# Patient Record
Sex: Female | Born: 1995 | Race: Black or African American | Hispanic: No | Marital: Single | Smoking: Never smoker
Health system: Southern US, Community
[De-identification: ages and names within clinical notes are randomized; demographics above are authoritative.]

## PROBLEM LIST (undated history)

## (undated) ENCOUNTER — Ambulatory Visit (HOSPITAL_COMMUNITY): Discharge status: Absent without leave | Payer: Self-pay

## (undated) DIAGNOSIS — D649 Anemia, unspecified: Secondary | ICD-10-CM

## (undated) DIAGNOSIS — J45909 Unspecified asthma, uncomplicated: Secondary | ICD-10-CM

## (undated) DIAGNOSIS — E119 Type 2 diabetes mellitus without complications: Secondary | ICD-10-CM

---

## 2017-08-11 ENCOUNTER — Emergency Department (HOSPITAL_COMMUNITY): Payer: Self-pay

## 2017-08-11 ENCOUNTER — Encounter (HOSPITAL_COMMUNITY): Payer: Self-pay

## 2017-08-11 ENCOUNTER — Emergency Department (HOSPITAL_COMMUNITY)
Admission: EM | Admit: 2017-08-11 | Discharge: 2017-08-11 | Disposition: A | Discharge status: Home / Self Care | Payer: Self-pay | Attending: Emergency Medicine | Admitting: Emergency Medicine

## 2017-08-11 DIAGNOSIS — J4521 Mild intermittent asthma with (acute) exacerbation: Secondary | ICD-10-CM | POA: Insufficient documentation

## 2017-08-11 DIAGNOSIS — E119 Type 2 diabetes mellitus without complications: Secondary | ICD-10-CM | POA: Insufficient documentation

## 2017-08-11 HISTORY — DX: Type 2 diabetes mellitus without complications: E11.9

## 2017-08-11 HISTORY — DX: Anemia, unspecified: D64.9

## 2017-08-11 LAB — CBG MONITORING, ED: GLUCOSE-CAPILLARY: 81 mg/dL (ref 65–99)

## 2017-08-11 MED ORDER — ALBUTEROL SULFATE HFA 108 (90 BASE) MCG/ACT IN AERS
1.0000 | INHALATION_SPRAY | RESPIRATORY_TRACT | Status: DC | PRN
  Administered 2017-08-11: 1 via RESPIRATORY_TRACT
  Filled 2017-08-11: qty 6.7

## 2017-08-11 MED ORDER — ALBUTEROL SULFATE (2.5 MG/3ML) 0.083% IN NEBU
5.0000 mg | INHALATION_SOLUTION | Freq: Once | RESPIRATORY_TRACT | Status: AC
  Administered 2017-08-11: 5 mg via RESPIRATORY_TRACT
  Filled 2017-08-11: qty 6

## 2017-08-11 MED ORDER — AEROCHAMBER PLUS FLO-VU LARGE MISC
1.0000 | Freq: Once | Status: AC
  Administered 2017-08-11: 1
  Filled 2017-08-11 (×3): qty 1

## 2017-08-11 MED ORDER — ALBUTEROL SULFATE (2.5 MG/3ML) 0.083% IN NEBU
2.5000 mg | INHALATION_SOLUTION | Freq: Once | RESPIRATORY_TRACT | Status: AC
  Administered 2017-08-11: 2.5 mg via RESPIRATORY_TRACT
  Filled 2017-08-11: qty 3

## 2017-08-11 MED ORDER — AEROCHAMBER PLUS FLO-VU MEDIUM MISC
1.0000 | Freq: Once | Status: DC
  Filled 2017-08-11: qty 1

## 2017-08-11 MED ORDER — DEXAMETHASONE SODIUM PHOSPHATE 10 MG/ML IJ SOLN
10.0000 mg | Freq: Once | INTRAMUSCULAR | Status: AC
  Administered 2017-08-11: 10 mg via INTRAMUSCULAR
  Filled 2017-08-11: qty 1

## 2017-08-11 NOTE — Discharge Instructions (Signed)
Avoid second hand smoke.  °

## 2017-08-11 NOTE — ED Provider Notes (Signed)
[facility] EMERGENCY DEPARTMENT Provider Note   CSN: [id number] Arrival date & time: 08/11/17  1314     History   Chief Complaint Chief Complaint  Patient presents with  . Asthma    HPI Tina Carlson is a 22 y.o. female.  Pt presents to the ED today with sob.  Pt is here visiting from [place] and is out of her inhaler.  Pt said there is 2nd hand smoke where she is staying.  Pt denies f/c.  Pt did get 1 neb in triage and has improved, but is still having some wheezing.      Past Medical History:  Diagnosis Date  . Anemia   . Diabetes mellitus without complication (HCC)     There are no active problems to display for this patient.   History reviewed. No pertinent surgical history.  OB History    No data available       Home Medications    Prior to Admission medications   Not on File    Family History No family history on file.  Social History Social History   Tobacco Use  . Smoking status: Never Smoker  . Smokeless tobacco: Never Used  Substance Use Topics  . Alcohol use: No    Frequency: Never  . Drug use: No     Allergies   Patient has no known allergies.   Review of Systems Review of Systems  Respiratory: Positive for cough, shortness of breath and wheezing.   All other systems reviewed and are negative.    Physical Exam Updated Vital Signs BP (!) 129/38 (BP Location: Right Arm)   Pulse 76   Temp 97.9 F (36.6 C) (Oral)   Resp (!) 22   Ht 5\' 6"  (1.676 m)   Wt 90.7 kg (200 lb)   LMP 07/11/2017 (Approximate)   SpO2 100%   BMI 32.28 kg/m   Physical Exam  Constitutional: She is oriented to person, place, and time. She appears well-developed and well-nourished.  HENT:  Head: Normocephalic and atraumatic.  Right Ear: External ear normal.  Left Ear: External ear normal.  Nose: Nose normal.  Mouth/Throat: Oropharynx is clear and moist.  Eyes: Conjunctivae and EOM are normal. Pupils are equal, round, and  reactive to light.  Neck: Normal range of motion. Neck supple.  Cardiovascular: Normal rate, regular rhythm, normal heart sounds and intact distal pulses.  Pulmonary/Chest: She has wheezes.  Abdominal: Soft. Bowel sounds are normal.  Musculoskeletal: Normal range of motion.  Neurological: She is alert and oriented to person, place, and time.  Skin: Skin is warm. Capillary refill takes less than 2 seconds.  Psychiatric: She has a normal mood and affect. Her behavior is normal. Judgment and thought content normal.  Nursing note and vitals reviewed.    ED Treatments / Results  Labs (all labs ordered are listed, but only abnormal results are displayed) Labs Reviewed  CBG MONITORING, ED    EKG  EKG Interpretation None       Radiology Dg Chest 2 View  Result Date: 08/11/2017 CLINICAL DATA:  Acute onset of cough and shortness of breath. Current history of asthma. EXAM: CHEST  2 VIEW COMPARISON:  None. FINDINGS: Cardiomediastinal silhouette unremarkable. Mildly prominent bronchovascular markings diffusely and mild to moderate central peribronchial thickening. Lungs otherwise clear. No localized airspace consolidation. No pleural effusions. No pneumothorax. Normal pulmonary vascularity. Visualized bony thorax intact. IMPRESSION: Mild-to-moderate changes of bronchitis and/or asthma without focal airspace pneumonia. Electronically Signed  By: Hulan Saashomas  Lawrence M.D.   On: 08/11/2017 15:04    Procedures Procedures (including critical care time)  Medications Ordered in ED Medications  albuterol (PROVENTIL HFA;VENTOLIN HFA) 108 (90 Base) MCG/ACT inhaler 1-2 puff (1 puff Inhalation Given 08/11/17 1624)  AEROCHAMBER PLUS FLO-VU MEDIUM MISC 1 each (not administered)  albuterol (PROVENTIL) (2.5 MG/3ML) 0.083% nebulizer solution 5 mg (5 mg Nebulization Given 08/11/17 1339)  albuterol (PROVENTIL) (2.5 MG/3ML) 0.083% nebulizer solution 2.5 mg (2.5 mg Nebulization Given 08/11/17 1621)  dexamethasone  (DECADRON) injection 10 mg (10 mg Intramuscular Given 08/11/17 1621)     Initial Impression / Assessment and Plan / ED Course  I have reviewed the triage vital signs and the nursing notes.  Pertinent labs & imaging results that were available during my care of the patient were reviewed by me and considered in my medical decision making (see chart for details).     Pt is feeling much better.  She is given an inhaler and spacer prior to d/c.  She knows to return if worse.  Final Clinical Impressions(s) / ED Diagnoses   Final diagnoses:  Mild intermittent asthma with exacerbation    ED Discharge Orders    None       Jacalyn LefevreHaviland, Lindzey Zent, MD 08/11/17 1649

## 2017-08-11 NOTE — ED Triage Notes (Signed)
HX of asthma. Visiting from [place] and ran out of inhaler last night. SPO2 100%. Wheezing audible.

## 2017-08-11 NOTE — ED Notes (Signed)
CBG taken at 16:30 was 81.

## 2020-10-09 ENCOUNTER — Encounter (HOSPITAL_COMMUNITY): Payer: Self-pay | Admitting: Emergency Medicine

## 2020-10-09 ENCOUNTER — Emergency Department (HOSPITAL_COMMUNITY)
Admission: EM | Admit: 2020-10-09 | Discharge: 2020-10-10 | Disposition: A | Discharge status: Home / Self Care | Payer: Self-pay | Attending: Emergency Medicine | Admitting: Emergency Medicine

## 2020-10-09 ENCOUNTER — Other Ambulatory Visit: Payer: Self-pay

## 2020-10-09 DIAGNOSIS — E119 Type 2 diabetes mellitus without complications: Secondary | ICD-10-CM | POA: Insufficient documentation

## 2020-10-09 DIAGNOSIS — K529 Noninfective gastroenteritis and colitis, unspecified: Secondary | ICD-10-CM | POA: Insufficient documentation

## 2020-10-09 LAB — COMPREHENSIVE METABOLIC PANEL
ALT: 15 U/L (ref 0–44)
AST: 20 U/L (ref 15–41)
Albumin: 3.2 g/dL — ABNORMAL LOW (ref 3.5–5.0)
Alkaline Phosphatase: 56 U/L (ref 38–126)
Anion gap: 5 (ref 5–15)
BUN: 13 mg/dL (ref 6–20)
CO2: 22 mmol/L (ref 22–32)
Calcium: 8.6 mg/dL — ABNORMAL LOW (ref 8.9–10.3)
Chloride: 108 mmol/L (ref 98–111)
Creatinine, Ser: 0.95 mg/dL (ref 0.44–1.00)
GFR, Estimated: >60 mL/min (ref >60)
Glucose, Bld: 85 mg/dL (ref 70–99)
Potassium: 3.5 mmol/L (ref 3.5–5.1)
Sodium: 135 mmol/L (ref 135–145)
Total Bilirubin: 0.6 mg/dL (ref 0.3–1.2)
Total Protein: 6.6 g/dL (ref 6.5–8.1)

## 2020-10-09 LAB — CBC
HCT: 39.7 % (ref 36.0–46.0)
Hemoglobin: 13.4 g/dL (ref 12.0–15.0)
MCH: 29.6 pg (ref 26.0–34.0)
MCHC: 33.8 g/dL (ref 30.0–36.0)
MCV: 87.6 fL (ref 80.0–100.0)
Platelets: 237 10*3/uL (ref 150–400)
RBC: 4.53 MIL/uL (ref 3.87–5.11)
RDW: 13 % (ref 11.5–15.5)
WBC: 3.8 10*3/uL — ABNORMAL LOW (ref 4.0–10.5)
nRBC: 0 % (ref 0.0–0.2)

## 2020-10-09 LAB — I-STAT BETA HCG BLOOD, ED (MC, WL, AP ONLY): I-stat hCG, quantitative: <5 m[IU]/mL (ref <5)

## 2020-10-09 LAB — LIPASE, BLOOD: Lipase: 24 U/L (ref 11–51)

## 2020-10-09 MED ORDER — DICYCLOMINE HCL 10 MG PO CAPS
10.0000 mg | ORAL_CAPSULE | Freq: Once | ORAL | Status: AC
  Administered 2020-10-10: 10 mg via ORAL
  Filled 2020-10-09: qty 1

## 2020-10-09 MED ORDER — LOPERAMIDE HCL 2 MG PO CAPS
4.0000 mg | ORAL_CAPSULE | Freq: Once | ORAL | Status: AC
  Administered 2020-10-10: 4 mg via ORAL
  Filled 2020-10-09: qty 2

## 2020-10-09 MED ORDER — ONDANSETRON 4 MG PO TBDP
4.0000 mg | ORAL_TABLET | Freq: Once | ORAL | Status: AC
  Administered 2020-10-10: 4 mg via ORAL
  Filled 2020-10-09: qty 1

## 2020-10-09 NOTE — ED Triage Notes (Signed)
Patient reports generalized abdominal pain for 3 days , no emesis , occasional diarrhea , denies fever or chills .

## 2020-10-10 MED ORDER — ONDANSETRON 4 MG PO TBDP: 4.0000 mg | ORAL_TABLET | Freq: Three times a day (TID) | ORAL | 0 refills | Status: AC | PRN

## 2020-10-10 NOTE — ED Notes (Signed)
After being roomed and provided a gown, patient left room to lobby to pick up her food. Unable to obtain VS as patient was not in room.

## 2020-10-10 NOTE — ED Provider Notes (Signed)
[facility] EMERGENCY DEPARTMENT Provider Note   CSN: [id number] Arrival date & time: 10/09/20  2015     History Chief Complaint  Patient presents with  . Abdominal Pain    Tina Carlson is a 25 y.o. female.  HPI     This is a 25 year old female with a history of diabetes who presents with nausea, anorexia, diarrhea.  Patient reports 3 to 4-day history of the symptoms.  She feels like she may have a stomach virus.  She has had some diffuse abdominal discomfort that she rates a 7 out of 10.  She is not had any vomiting but has had nausea.  No fevers.  No chest pain, shortness of breath, cough.  She reports that she has a nephew with similar symptoms.  She has taken Tylenol at home with minimal relief.  She states that she has been able to keep down some fluids and crackers.  Generally she does not feel like eating.  She does not believe herself to be pregnant.  Past Medical History:  Diagnosis Date  . Anemia   . Diabetes mellitus without complication (HCC)     There are no problems to display for this patient.   History reviewed. No pertinent surgical history.   OB History   No obstetric history on file.     No family history on file.  Social History   Tobacco Use  . Smoking status: Never Smoker  . Smokeless tobacco: Never Used  Substance Use Topics  . Alcohol use: No  . Drug use: No    Home Medications Prior to Admission medications   Medication Sig Start Date End Date Taking? Authorizing Provider  ondansetron (ZOFRAN ODT) 4 MG disintegrating tablet Take 1 tablet (4 mg total) by mouth every 8 (eight) hours as needed for nausea or vomiting. 10/10/20  Yes Markos Theil, Mayer Masker, MD    Allergies    Patient has no known allergies.  Review of Systems   Review of Systems  Constitutional: Negative for fever.  Respiratory: Negative for cough and shortness of breath.   Cardiovascular: Negative for chest pain.  Gastrointestinal: Positive for  abdominal pain, diarrhea and nausea. Negative for vomiting.  Genitourinary: Negative for dysuria.  All other systems reviewed and are negative.   Physical Exam Updated Vital Signs BP (!) 103/57   Pulse 60   Temp 99.7 F (37.6 C) (Oral)   Resp 12   Ht 1.626 m (5\' 4" )   Wt 85 kg   SpO2 100%   BMI 32.17 kg/m   Physical Exam Vitals and nursing note reviewed.  Constitutional:      Appearance: She is well-developed. She is obese. She is not ill-appearing.  HENT:     Head: Normocephalic and atraumatic.     Mouth/Throat:     Mouth: Mucous membranes are moist.  Eyes:     Pupils: Pupils are equal, round, and reactive to light.  Cardiovascular:     Rate and Rhythm: Normal rate and regular rhythm.     Heart sounds: Normal heart sounds.  Pulmonary:     Effort: Pulmonary effort is normal. No respiratory distress.     Breath sounds: No wheezing.  Abdominal:     General: Bowel sounds are normal.     Palpations: Abdomen is soft.     Tenderness: There is no abdominal tenderness.  Musculoskeletal:     Cervical back: Neck supple.  Skin:    General: Skin is warm and dry.  Neurological:     Mental Status: She is alert and oriented to person, place, and time.  Psychiatric:        Mood and Affect: Mood normal.     ED Results / Procedures / Treatments   Labs (all labs ordered are listed, but only abnormal results are displayed) Labs Reviewed  COMPREHENSIVE METABOLIC PANEL - Abnormal; Notable for the following components:      Result Value   Calcium 8.6 (*)    Albumin 3.2 (*)    All other components within normal limits  CBC - Abnormal; Notable for the following components:   WBC 3.8 (*)    All other components within normal limits  LIPASE, BLOOD  URINALYSIS, ROUTINE W REFLEX MICROSCOPIC  I-STAT BETA HCG BLOOD, ED ([facility], [facility], [facility] ONLY)    EKG None  Radiology No results found.  Procedures Procedures   Medications Ordered in ED Medications  ondansetron (ZOFRAN-ODT)  disintegrating tablet 4 mg (4 mg Oral Given 10/10/20 0026)  loperamide (IMODIUM) capsule 4 mg (4 mg Oral Given 10/10/20 0026)  dicyclomine (BENTYL) capsule 10 mg (10 mg Oral Given 10/10/20 0026)    ED Course  I have reviewed the triage vital signs and the nursing notes.  Pertinent labs & imaging results that were available during my care of the patient were reviewed by me and considered in my medical decision making (see chart for details).    MDM Rules/Calculators/A&P                          Patient presents with anorexia, nausea, diarrhea.  Overall nontoxic vital signs are reassuring.  Abdominal exam is benign.  No reproducible tenderness.  No signs of peritonitis.  Highly suspect viral etiology.  Low suspicion for intra-abdominal pathology such as cholecystitis, pancreatitis, appendicitis.  Labs obtained.  Lipase and LFTs are normal.  No leukocytosis.  Patient was given Bentyl, Imodium, and Zofran.  On recheck, she is eating [employer].  Do not feel she needs further work-up.  Will discharge home with Zofran and supportive measures.  After history, exam, and medical workup I feel the patient has been appropriately medically screened and is safe for discharge home. Pertinent diagnoses were discussed with the patient. Patient was given return precautions.  Final Clinical Impression(s) / ED Diagnoses Final diagnoses:  Gastroenteritis    Rx / DC Orders ED Discharge Orders         Ordered    ondansetron (ZOFRAN ODT) 4 MG disintegrating tablet  Every 8 hours PRN        10/10/20 0109           Shon Baton, MD 10/10/20 0110

## 2020-12-03 ENCOUNTER — Encounter (HOSPITAL_COMMUNITY): Payer: Self-pay | Admitting: Emergency Medicine

## 2020-12-03 ENCOUNTER — Other Ambulatory Visit: Payer: Self-pay

## 2020-12-03 ENCOUNTER — Emergency Department (HOSPITAL_COMMUNITY)
Admission: EM | Admit: 2020-12-03 | Discharge: 2020-12-04 | Disposition: A | Discharge status: Home / Self Care | Payer: Self-pay | Attending: Emergency Medicine | Admitting: Emergency Medicine

## 2020-12-03 DIAGNOSIS — Z20822 Contact with and (suspected) exposure to covid-19: Secondary | ICD-10-CM | POA: Insufficient documentation

## 2020-12-03 DIAGNOSIS — J4541 Moderate persistent asthma with (acute) exacerbation: Secondary | ICD-10-CM | POA: Insufficient documentation

## 2020-12-03 DIAGNOSIS — E119 Type 2 diabetes mellitus without complications: Secondary | ICD-10-CM | POA: Insufficient documentation

## 2020-12-03 HISTORY — DX: Unspecified asthma, uncomplicated: J45.909

## 2020-12-03 MED ORDER — ALBUTEROL SULFATE HFA 108 (90 BASE) MCG/ACT IN AERS
2.0000 | INHALATION_SPRAY | Freq: Once | RESPIRATORY_TRACT | Status: AC
  Administered 2020-12-03: 2 via RESPIRATORY_TRACT
  Filled 2020-12-03: qty 6.7

## 2020-12-03 NOTE — ED Triage Notes (Signed)
Patient reports asthma with wheezing and chest congestion for several days unrelieved by inhaler , no fever or chills.

## 2020-12-04 ENCOUNTER — Emergency Department (HOSPITAL_COMMUNITY): Payer: Self-pay

## 2020-12-04 LAB — RESP PANEL BY RT-PCR (FLU A&B, COVID) ARPGX2
Influenza A by PCR: NEGATIVE
Influenza B by PCR: NEGATIVE
SARS Coronavirus 2 by RT PCR: NEGATIVE

## 2020-12-04 LAB — CBG MONITORING, ED: Glucose-Capillary: 91 mg/dL (ref 70–99)

## 2020-12-04 MED ORDER — ALBUTEROL SULFATE (2.5 MG/3ML) 0.083% IN NEBU
2.5000 mg | INHALATION_SOLUTION | Freq: Once | RESPIRATORY_TRACT | Status: AC
  Administered 2020-12-04: 2.5 mg via RESPIRATORY_TRACT
  Filled 2020-12-04: qty 3

## 2020-12-04 MED ORDER — DEXAMETHASONE SODIUM PHOSPHATE 10 MG/ML IJ SOLN
10.0000 mg | Freq: Once | INTRAMUSCULAR | Status: AC
  Administered 2020-12-04: 10 mg via INTRAMUSCULAR
  Filled 2020-12-04: qty 1

## 2020-12-04 MED ORDER — ALBUTEROL SULFATE HFA 108 (90 BASE) MCG/ACT IN AERS: 2.0000 | INHALATION_SPRAY | RESPIRATORY_TRACT | 2 refills | Status: DC | PRN

## 2020-12-04 MED ORDER — PREDNISONE 20 MG PO TABS: 40.0000 mg | ORAL_TABLET | Freq: Every day | ORAL | 0 refills | Status: DC

## 2020-12-04 MED ORDER — IPRATROPIUM-ALBUTEROL 0.5-2.5 (3) MG/3ML IN SOLN
3.0000 mL | Freq: Once | RESPIRATORY_TRACT | Status: AC
  Administered 2020-12-04: 3 mL via RESPIRATORY_TRACT
  Filled 2020-12-04: qty 3

## 2020-12-04 NOTE — ED Notes (Signed)
Covid swab sent to lab.

## 2020-12-04 NOTE — ED Provider Notes (Signed)
[facility] EMERGENCY DEPARTMENT Provider Note   CSN: [id number] Arrival date & time: 12/03/20  2235     History Chief Complaint  Patient presents with  . Asthma    Tina Carlson is a 25 y.o. female.  Patient with history of asthma presents to the emergency department with a 2-week history of progressively worsening wheezing.  Patient reports that she has been using her inhaler and it does help with shortness of breath comes back.  She reports she has even been having to get up in the middle of the night to use her inhaler.  She has not had any fever, cough or infectious symptoms.        Past Medical History:  Diagnosis Date  . Anemia   . Asthma   . Diabetes mellitus without complication (HCC)     There are no problems to display for this patient.   History reviewed. No pertinent surgical history.   OB History   No obstetric history on file.     No family history on file.  Social History   Tobacco Use  . Smoking status: Never Smoker  . Smokeless tobacco: Never Used  Substance Use Topics  . Alcohol use: No  . Drug use: No    Home Medications Prior to Admission medications   Medication Sig Start Date End Date Taking? Authorizing Provider  albuterol (VENTOLIN HFA) 108 (90 Base) MCG/ACT inhaler Inhale 2 puffs into the lungs every 4 (four) hours as needed for wheezing or shortness of breath. 12/04/20  Yes Oseias Horsey, Canary Brim, MD  predniSONE (DELTASONE) 20 MG tablet Take 2 tablets (40 mg total) by mouth daily with breakfast. 12/04/20  Yes Birdella Sippel, Canary Brim, MD  ondansetron (ZOFRAN ODT) 4 MG disintegrating tablet Take 1 tablet (4 mg total) by mouth every 8 (eight) hours as needed for nausea or vomiting. 10/10/20   Horton, Mayer Masker, MD    Allergies    Patient has no known allergies.  Review of Systems   Review of Systems  Respiratory: Positive for wheezing.   All other systems reviewed and are negative.   Physical Exam Updated  Vital Signs BP 114/71   Pulse 83   Temp 98.1 F (36.7 C) (Oral)   Resp 16   LMP 11/12/2020   SpO2 100%   Physical Exam Vitals and nursing note reviewed.  Constitutional:      General: She is not in acute distress.    Appearance: Normal appearance. She is well-developed.  HENT:     Head: Normocephalic and atraumatic.     Right Ear: Hearing normal.     Left Ear: Hearing normal.     Nose: Nose normal.  Eyes:     Conjunctiva/sclera: Conjunctivae normal.     Pupils: Pupils are equal, round, and reactive to light.  Cardiovascular:     Rate and Rhythm: Regular rhythm.     Heart sounds: S1 normal and S2 normal. No murmur heard. No friction rub. No gallop.   Pulmonary:     Effort: Pulmonary effort is normal. Prolonged expiration present. No respiratory distress.     Breath sounds: Wheezing present.  Chest:     Chest wall: No tenderness.  Abdominal:     General: Bowel sounds are normal.     Palpations: Abdomen is soft.     Tenderness: There is no abdominal tenderness. There is no guarding or rebound. Negative signs include Murphy's sign and McBurney's sign.     Hernia: No hernia  is present.  Musculoskeletal:        General: Normal range of motion.     Cervical back: Normal range of motion and neck supple.  Skin:    General: Skin is warm and dry.     Findings: No rash.  Neurological:     Mental Status: She is alert and oriented to person, place, and time.     GCS: GCS eye subscore is 4. GCS verbal subscore is 5. GCS motor subscore is 6.     Cranial Nerves: No cranial nerve deficit.     Sensory: No sensory deficit.     Coordination: Coordination normal.  Psychiatric:        Speech: Speech normal.        Behavior: Behavior normal.        Thought Content: Thought content normal.     ED Results / Procedures / Treatments   Labs (all labs ordered are listed, but only abnormal results are displayed) Labs Reviewed  CBG MONITORING, ED    EKG None  Radiology DG Chest 2  View  Result Date: 12/04/2020 CLINICAL DATA:  Asthma. EXAM: CHEST - 2 VIEW COMPARISON:  Chest x-ray 08/11/2017 FINDINGS: The heart size and mediastinal contours are within normal limits. No focal consolidation. No pulmonary edema. No pleural effusion. No pneumothorax. No acute osseous abnormality. Left neck findings likely external to patient IMPRESSION: No active cardiopulmonary disease. Electronically Signed   By: Tish Frederickson M.D.   On: 12/04/2020 01:48    Procedures Procedures   Medications Ordered in ED Medications  dexamethasone (DECADRON) injection 10 mg (has no administration in time range)  albuterol (VENTOLIN HFA) 108 (90 Base) MCG/ACT inhaler 2 puff (2 puffs Inhalation Given 12/03/20 2350)    ED Course  I have reviewed the triage vital signs and the nursing notes.  Pertinent labs & imaging results that were available during my care of the patient were reviewed by me and considered in my medical decision making (see chart for details).    MDM Rules/Calculators/A&P                          Patient presents to the emergency department for evaluation of asthma exacerbation ongoing for 2 weeks.  She has been using her inhaler without improvement.  Examination reveals mild wheezing at this time but no distress.  No hypoxia.  Chest x-ray is clear.  Would likely benefit from corticosteroid administration.  Past medical history includes diabetes but blood sugar is normal today.  Final Clinical Impression(s) / ED Diagnoses Final diagnoses:  Moderate persistent asthma with exacerbation    Rx / DC Orders ED Discharge Orders         Ordered    albuterol (VENTOLIN HFA) 108 (90 Base) MCG/ACT inhaler  Every 4 hours PRN        12/04/20 0202    predniSONE (DELTASONE) 20 MG tablet  Daily with breakfast        12/04/20 0202           Gilda Crease, MD 12/04/20 0202

## 2020-12-04 NOTE — Discharge Instructions (Signed)
You will need steroids to help with the swelling in your lungs.  Sometimes steroids can increase blood sugar, if you develop excessive thirst or frequent urination, return to the ER for a recheck of your blood sugar.  If your breathing worsens return to the ER.

## 2020-12-21 ENCOUNTER — Encounter (HOSPITAL_COMMUNITY): Payer: Self-pay | Admitting: *Deleted

## 2020-12-21 ENCOUNTER — Emergency Department (HOSPITAL_COMMUNITY)
Admission: EM | Admit: 2020-12-21 | Discharge: 2020-12-21 | Disposition: A | Discharge status: Home / Self Care | Payer: Self-pay | Attending: Emergency Medicine | Admitting: Emergency Medicine

## 2020-12-21 ENCOUNTER — Other Ambulatory Visit: Payer: Self-pay

## 2020-12-21 ENCOUNTER — Emergency Department (HOSPITAL_COMMUNITY): Payer: Self-pay

## 2020-12-21 DIAGNOSIS — Z20822 Contact with and (suspected) exposure to covid-19: Secondary | ICD-10-CM | POA: Insufficient documentation

## 2020-12-21 DIAGNOSIS — R062 Wheezing: Secondary | ICD-10-CM

## 2020-12-21 DIAGNOSIS — J4541 Moderate persistent asthma with (acute) exacerbation: Secondary | ICD-10-CM | POA: Insufficient documentation

## 2020-12-21 DIAGNOSIS — E119 Type 2 diabetes mellitus without complications: Secondary | ICD-10-CM | POA: Insufficient documentation

## 2020-12-21 LAB — RESP PANEL BY RT-PCR (FLU A&B, COVID) ARPGX2
Influenza A by PCR: NEGATIVE
Influenza B by PCR: NEGATIVE
SARS Coronavirus 2 by RT PCR: NEGATIVE

## 2020-12-21 MED ORDER — IPRATROPIUM-ALBUTEROL 0.5-2.5 (3) MG/3ML IN SOLN
3.0000 mL | Freq: Once | RESPIRATORY_TRACT | Status: AC
  Administered 2020-12-21: 3 mL via RESPIRATORY_TRACT
  Filled 2020-12-21: qty 3

## 2020-12-21 MED ORDER — ALBUTEROL SULFATE HFA 108 (90 BASE) MCG/ACT IN AERS: 2.0000 | INHALATION_SPRAY | Freq: Four times a day (QID) | RESPIRATORY_TRACT | 2 refills | Status: DC | PRN

## 2020-12-21 MED ORDER — ALBUTEROL SULFATE HFA 108 (90 BASE) MCG/ACT IN AERS: 2.0000 | INHALATION_SPRAY | RESPIRATORY_TRACT | 0 refills | Status: AC | PRN

## 2020-12-21 MED ORDER — DEXAMETHASONE SODIUM PHOSPHATE 10 MG/ML IJ SOLN
8.0000 mg | Freq: Once | INTRAMUSCULAR | Status: AC
  Administered 2020-12-21: 8 mg via INTRAMUSCULAR
  Filled 2020-12-21: qty 1

## 2020-12-21 MED ORDER — PREDNISONE 20 MG PO TABS: 40.0000 mg | ORAL_TABLET | Freq: Every day | ORAL | 0 refills | Status: AC

## 2020-12-21 NOTE — ED Provider Notes (Signed)
[facility] EMERGENCY DEPARTMENT Provider Note   CSN: [id number] Arrival date & time: 12/21/20  1104     History Chief Complaint  Patient presents with  . Asthma    Tina Carlson is a 25 y.o. female.  HPI Patient is a 25 year old female with a history of diabetes mellitus as well as asthma who presents to the emergency department due to wheezing and chest tightness.  She states her symptoms are consistent with prior asthma exacerbations.  Her symptoms started once again about 5 days ago.  She states that she was seen earlier this month with the same symptoms and was given an albuterol inhaler as well as prednisone.  She states the prednisone improved her symptoms for a short period of time.  She states she has continued to use her inhaler increasing amounts with minimal relief.  Denies any sore throat or rhinorrhea.  She smokes intermittently.    Past Medical History:  Diagnosis Date  . Anemia   . Asthma   . Diabetes mellitus without complication (HCC)     There are no problems to display for this patient.   History reviewed. No pertinent surgical history.   OB History   No obstetric history on file.     History reviewed. No pertinent family history.  Social History   Tobacco Use  . Smoking status: Never Smoker  . Smokeless tobacco: Never Used  Substance Use Topics  . Alcohol use: No  . Drug use: No    Home Medications Prior to Admission medications   Medication Sig Start Date End Date Taking? Authorizing Provider  albuterol (VENTOLIN HFA) 108 (90 Base) MCG/ACT inhaler Inhale 2 puffs into the lungs every 4 (four) hours as needed for wheezing or shortness of breath. 12/04/20   Gilda Crease, MD  ondansetron (ZOFRAN ODT) 4 MG disintegrating tablet Take 1 tablet (4 mg total) by mouth every 8 (eight) hours as needed for nausea or vomiting. 10/10/20   Horton, Mayer Masker, MD  predniSONE (DELTASONE) 20 MG tablet Take 2 tablets (40 mg total) by  mouth daily with breakfast. 12/04/20   Pollina, Canary Brim, MD    Allergies    Peach [prunus persica]  Review of Systems   Review of Systems  Constitutional: Negative for chills and fever.  HENT: Negative for congestion and sore throat.   Respiratory: Positive for chest tightness and wheezing. Negative for cough and shortness of breath.   Cardiovascular: Negative for chest pain.   Physical Exam Updated Vital Signs BP 110/75 (BP Location: Right Arm)   Pulse 78   Temp 99 F (37.2 C) (Oral)   Resp 19   LMP 12/14/2020   SpO2 100%   Physical Exam Vitals and nursing note reviewed.  Constitutional:      General: She is not in acute distress.    Appearance: Normal appearance. She is not ill-appearing, toxic-appearing or diaphoretic.  HENT:     Head: Normocephalic and atraumatic.     Right Ear: External ear normal.     Left Ear: External ear normal.     Nose: Nose normal.     Mouth/Throat:     Mouth: Mucous membranes are moist.     Pharynx: Oropharynx is clear. No oropharyngeal exudate or posterior oropharyngeal erythema.  Eyes:     General: No scleral icterus.       Right eye: No discharge.        Left eye: No discharge.     Extraocular Movements:  Extraocular movements intact.     Conjunctiva/sclera: Conjunctivae normal.  Cardiovascular:     Rate and Rhythm: Normal rate and regular rhythm.     Pulses: Normal pulses.     Heart sounds: Normal heart sounds. No murmur heard. No friction rub. No gallop.   Pulmonary:     Effort: Pulmonary effort is normal. No respiratory distress.     Breath sounds: No stridor. Wheezing present. No rhonchi or rales.     Comments: Mild expiratory wheezes noted in the left posterior lung fields.  No wheezing noted on the right.  Oxygen saturations at 100% on room air.  Speaking in clear and complete sentences.  No respiratory distress noted. Abdominal:     General: Abdomen is flat.  Musculoskeletal:        General: Normal range of motion.      Cervical back: Normal range of motion and neck supple. No tenderness.  Skin:    General: Skin is warm and dry.  Neurological:     General: No focal deficit present.     Mental Status: She is alert and oriented to person, place, and time.  Psychiatric:        Mood and Affect: Mood normal.        Behavior: Behavior normal.    ED Results / Procedures / Treatments   Labs (all labs ordered are listed, but only abnormal results are displayed) Labs Reviewed  RESP PANEL BY RT-PCR (FLU A&B, COVID) ARPGX2   EKG None  Radiology No results found.  Procedures Procedures   Medications Ordered in ED Medications  ipratropium-albuterol (DUONEB) 0.5-2.5 (3) MG/3ML nebulizer solution 3 mL (has no administration in time range)  ipratropium-albuterol (DUONEB) 0.5-2.5 (3) MG/3ML nebulizer solution 3 mL (has no administration in time range)  dexamethasone (DECADRON) injection 8 mg (8 mg Intramuscular Given 12/21/20 1529)   ED Course  I have reviewed the triage vital signs and the nursing notes.  Pertinent labs & imaging results that were available during my care of the patient were reviewed by me and considered in my medical decision making (see chart for details).    MDM Rules/Calculators/A&P                          Patient is a 25 year old female with a history of asthma who presents to the emergency department due to what appears to be an asthma exacerbation.  Physical exam is generally reassuring.  She is having some mild chest tightness and I noted some mild expiratory wheezes on the left side but otherwise no significant wheezing noted.  Speaking in clear and complete sentences.  No respiratory distress.  Oxygen saturations at 100% on room air.  Patient requested steroids in addition to duo nebs.  These have been ordered.  It is the end of my shift and patient care is being transferred to [facility].  Patient pending DuoNebs and reassessment prior to being discharged home.  Final Clinical  Impression(s) / ED Diagnoses Final diagnoses:  Moderate persistent asthma with exacerbation   Rx / DC Orders ED Discharge Orders    None       Placido Sou, PA-C 12/21/20 1532    Benjiman Core, MD 12/25/20 [phone]

## 2020-12-21 NOTE — ED Triage Notes (Signed)
Pt reports having increase in asthma symptoms and wheezing. Was here on 5/8 for same and given inhaler and prednisone. Reports no relief with inhaler pta. No resp distress noted.

## 2020-12-21 NOTE — ED Provider Notes (Signed)
  Physical Exam  BP 110/75 (BP Location: Right Arm)   Pulse 78   Temp 99 F (37.2 C) (Oral)   Resp 19   LMP 12/14/2020   SpO2 100%   Physical Exam  ED Course/Procedures     Procedures  MDM  Patient care received from [facility] J.  PA-C at shift change, please see his note for full HPI.  Briefly, patient here with prior history of diabetes, asthma coming in for wheezing along with chest tightness.  She does report a history of smoking intermittently.  Been using inhaler without much improvement.  Plan is for COVID testing, duo nebs pending reevaluation and disposition.  According to prior assessment patient is speaking in complete sentences, no respiratory distress.  Vitals were reviewed by me, satting at 100% on room air.  She is currently awaiting DuoNebs.  5:01 PM patient has completed duo neb treatment, she reports improvement in her symptoms.  Lungs were auscultated with me with minimal wheezing noted.  Stats remain at 99% on room air.  We discussed prescription for nebulizer treatment at home along with a refill on her inhaler.  Prior provider has provided prescriptions for inhaler along with Decadron.  He has also been given a prescription for a nebulizer machine for home.  Patient stable for discharge.  Portions of this note were generated with Scientist, clinical (histocompatibility and immunogenetics). Dictation errors may occur despite best attempts at proofreading.        Claude Manges, PA-C 12/21/20 1705    Milagros Loll, MD 12/22/20 [phone]

## 2020-12-21 NOTE — Discharge Instructions (Addendum)
I prescribed you 3 more days of prednisone as well as refilled your albuterol inhaler.  Please return to the emergency department if you develop any new or worsening symptoms.  It was a pleasure to meet you.

## 2023-03-29 IMAGING — CR DG CHEST 2V
2 series · 2 of 2 positions shown · non-contrast
Comparison: Chest x-ray 08/11/2017

CLINICAL DATA: Asthma.

EXAM:
CHEST - 2 VIEW

[chest pa]
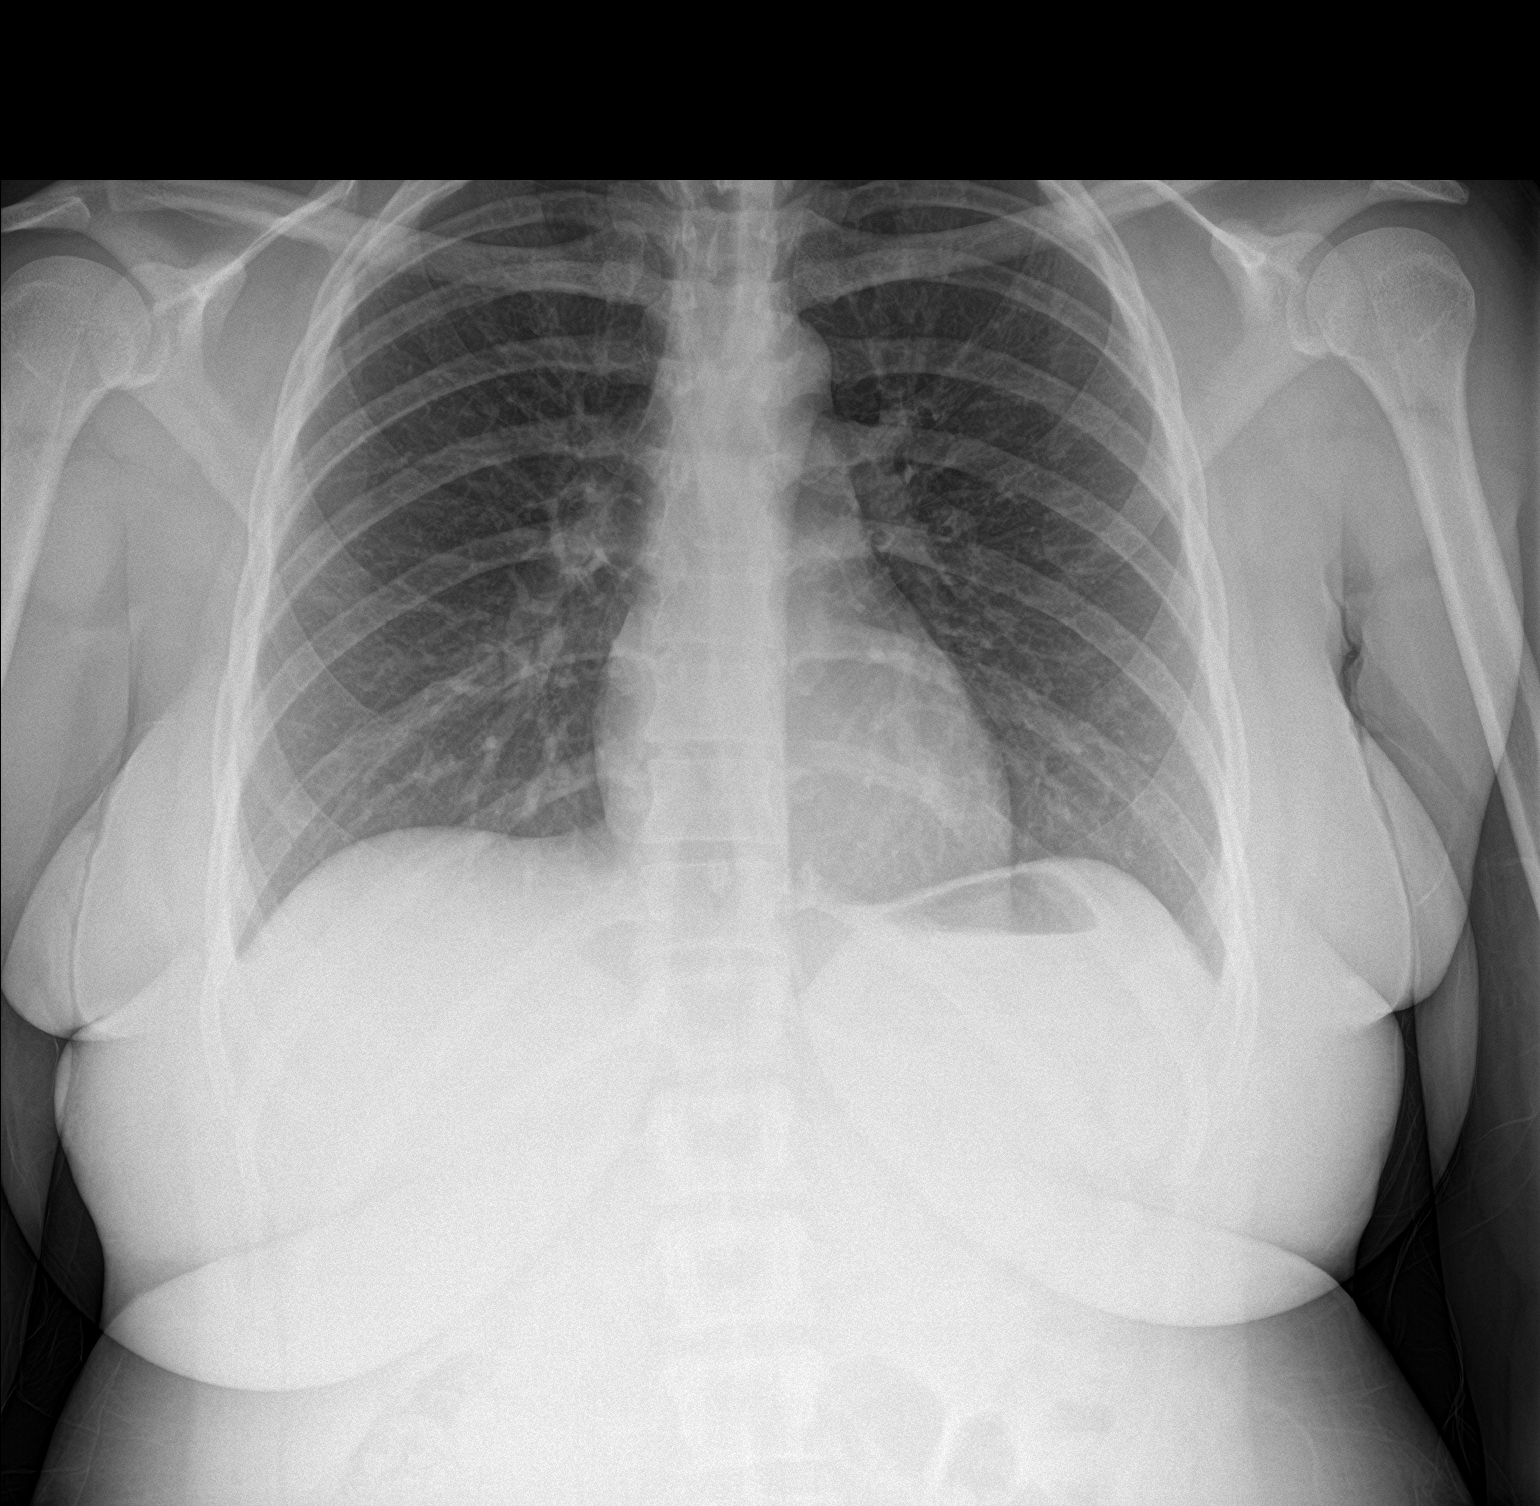

[chest lat]
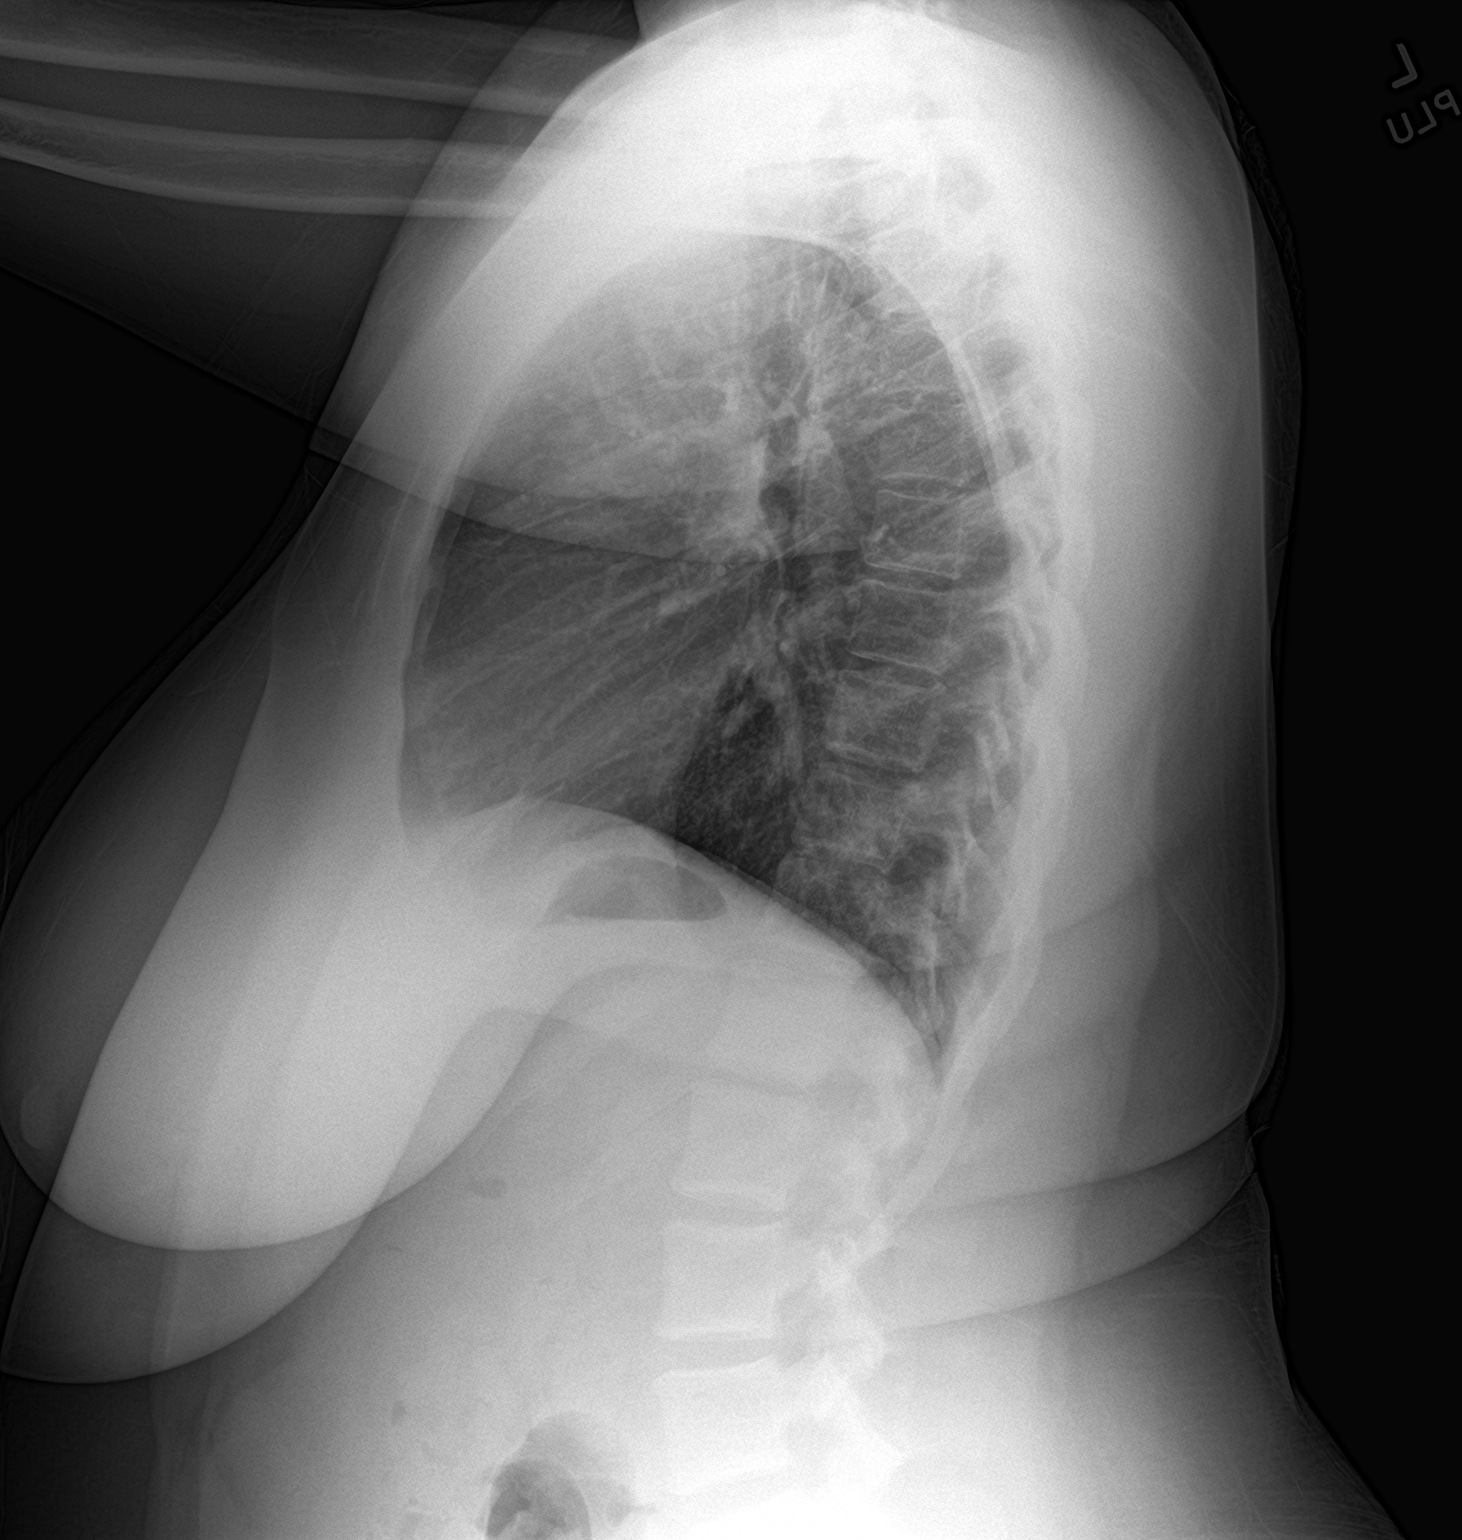

[2 of 2 positions shown; findings below may reference images not displayed]

FINDINGS: The heart size and mediastinal contours are within normal limits.

No focal consolidation. No pulmonary edema. No pleural effusion. No
pneumothorax.

No acute osseous abnormality.

Left neck findings likely external to patient
IMPRESSION: No active cardiopulmonary disease.

## 2023-04-15 IMAGING — DX DG CHEST 1V
1 series · 1 of 1 positions shown · non-contrast
Comparison: 12/04/2020

CLINICAL DATA: Wheezing

EXAM:
CHEST  1 VIEW

[chest pa]
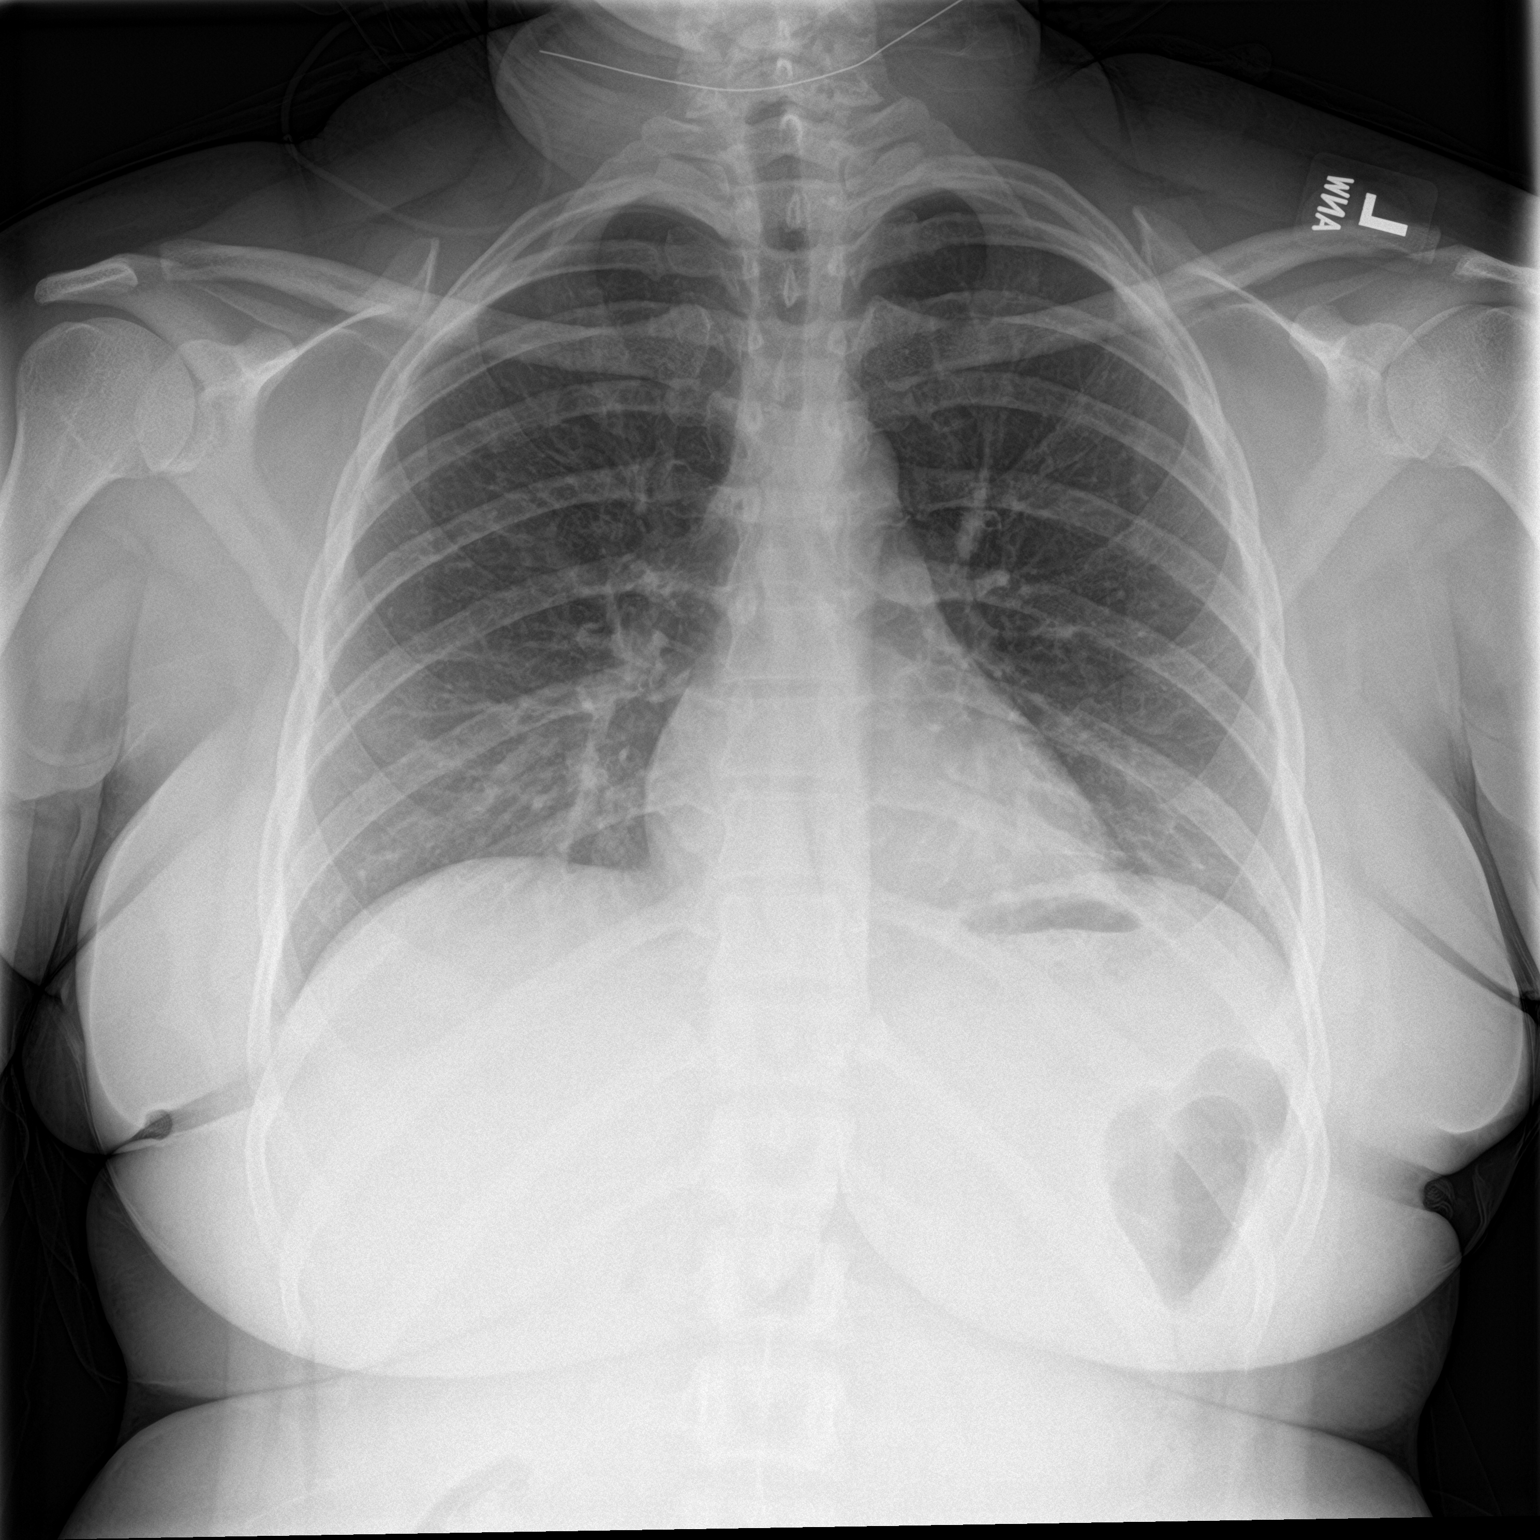

[1 of 1 positions shown; findings below may reference images not displayed]

FINDINGS: Relatively low lung volumes. No confluent infiltrate or overt edema.

Heart size and mediastinal contours are within normal limits.

No effusion.  No pneumothorax.

Visualized bones unremarkable.
IMPRESSION: Low volumes.  No acute disease.

## 2024-02-29 ENCOUNTER — Emergency Department (HOSPITAL_COMMUNITY): Payer: PRIVATE HEALTH INSURANCE

## 2024-02-29 ENCOUNTER — Emergency Department (HOSPITAL_COMMUNITY)
Admission: EM | Admit: 2024-02-29 | Discharge: 2024-02-29 | Disposition: A | Discharge status: Home / Self Care | Payer: PRIVATE HEALTH INSURANCE | Attending: Student in an Organized Health Care Education/Training Program | Admitting: Student in an Organized Health Care Education/Training Program

## 2024-02-29 ENCOUNTER — Other Ambulatory Visit: Payer: Self-pay

## 2024-02-29 DIAGNOSIS — J4541 Moderate persistent asthma with (acute) exacerbation: Secondary | ICD-10-CM | POA: Diagnosis not present

## 2024-02-29 DIAGNOSIS — R0602 Shortness of breath: Secondary | ICD-10-CM | POA: Diagnosis present

## 2024-02-29 MED ORDER — ALBUTEROL SULFATE HFA 108 (90 BASE) MCG/ACT IN AERS
1.0000 | INHALATION_SPRAY | RESPIRATORY_TRACT | Status: DC | PRN
  Administered 2024-02-29: 1 via RESPIRATORY_TRACT
  Filled 2024-02-29: qty 6.7

## 2024-02-29 MED ORDER — IPRATROPIUM-ALBUTEROL 0.5-2.5 (3) MG/3ML IN SOLN
3.0000 mL | Freq: Once | RESPIRATORY_TRACT | Status: AC
  Administered 2024-02-29: 3 mL via RESPIRATORY_TRACT
  Filled 2024-02-29: qty 3

## 2024-02-29 MED ORDER — PREDNISONE 20 MG PO TABS
60.0000 mg | ORAL_TABLET | Freq: Once | ORAL | Status: AC
  Administered 2024-02-29: 60 mg via ORAL
  Filled 2024-02-29: qty 3

## 2024-02-29 MED ORDER — ACETAMINOPHEN 500 MG PO TABS
1000.0000 mg | ORAL_TABLET | Freq: Once | ORAL | Status: AC
  Administered 2024-02-29: 1000 mg via ORAL
  Filled 2024-02-29: qty 2

## 2024-02-29 MED ORDER — DIPHENHYDRAMINE HCL 25 MG PO CAPS
50.0000 mg | ORAL_CAPSULE | Freq: Once | ORAL | Status: AC
  Administered 2024-02-29: 50 mg via ORAL
  Filled 2024-02-29: qty 2

## 2024-02-29 NOTE — ED Notes (Signed)
 Patient Alert and oriented to baseline. Stable and ambulatory to baseline. Patient verbalized understanding of the discharge instructions.  Patient belongings were taken by the patient.

## 2024-02-29 NOTE — ED Provider Notes (Signed)
 [place] EMERGENCY DEPARTMENT AT [facility] Provider Note   CSN: [id number] Arrival date & time: 02/29/24  1105     Patient presents with: Asthma   Tina Carlson is a 28 y.o. female who presents to the ED today with reports of increasing shortness of breath over the last week.  She has a history of asthma, takes Symbicort and has albuterol  as a rescue inhaler at baseline, however she is from out of town and inadvertently left her Symbicort at home as well as her normal allergy medication, cetirizine.  Over this period she has had increasing shortness of breath and increasing use of her albuterol  inhaler leading to her to deplete her rescue inhaler over the last 48 hours.  She presents to the ED in no apparent distress, and is not having any shortness of breath at present however states that she has had increased cough, phlegm production, and has had difficulty sleeping secondary to her recurrent need for use of her albuterol  inhaler.  On review of outside medical records, she has been prescribed 10 mg cetirizine to be taken daily, along with fluticasone nasal spray for seasonal allergic rhinitis of which she is post to do 2 sprays each nostril once daily.  She is also on Symbicort inhaler twice daily.  Further as taking montelukast once nightly.    Asthma Associated symptoms include shortness of breath.       Prior to Admission medications   Medication Sig Start Date End Date Taking? Authorizing Provider  albuterol  (VENTOLIN  HFA) 108 (90 Base) MCG/ACT inhaler Inhale 2 puffs into the lungs every 4 (four) hours as needed for wheezing or shortness of breath. 12/21/20 01/20/21  Soto, Johana, PA-C  ondansetron  (ZOFRAN  ODT) 4 MG disintegrating tablet Take 1 tablet (4 mg total) by mouth every 8 (eight) hours as needed for nausea or vomiting. 10/10/20   Horton, Charmaine FALCON, MD    Allergies: Peach [prunus persica]    Review of Systems  Respiratory:  Positive for cough, shortness of  breath and wheezing.   All other systems reviewed and are negative.   Updated Vital Signs BP 102/68 (BP Location: Right Arm)   Pulse 79   Temp 98.2 F (36.8 C) (Oral)   Resp 15   Ht 5' 6 (1.676 m)   Wt 97.5 kg   SpO2 99%   BMI 34.70 kg/m   Physical Exam Vitals and nursing note reviewed.  Constitutional:      General: She is not in acute distress.    Appearance: Normal appearance.  HENT:     Head: Normocephalic and atraumatic.     Nose: Mucosal edema and congestion present.     Right Turbinates: Enlarged, swollen and pale.     Left Turbinates: Enlarged, swollen and pale.     Comments: Bilateral nasal turbinates are markedly edematous and pale    Mouth/Throat:     Mouth: Mucous membranes are moist.     Pharynx: Oropharynx is clear.  Eyes:     Extraocular Movements: Extraocular movements intact.     Conjunctiva/sclera: Conjunctivae normal.     Pupils: Pupils are equal, round, and reactive to light.  Cardiovascular:     Rate and Rhythm: Normal rate and regular rhythm.     Pulses: Normal pulses.     Heart sounds: Normal heart sounds. No murmur heard.    No friction rub. No gallop.  Pulmonary:     Effort: Pulmonary effort is normal.     Breath sounds:  Normal breath sounds and air entry.  Abdominal:     General: Abdomen is flat. Bowel sounds are normal.     Palpations: Abdomen is soft.  Musculoskeletal:        General: Normal range of motion.     Cervical back: Normal range of motion and neck supple.     Right lower leg: No edema.     Left lower leg: No edema.  Skin:    General: Skin is warm and dry.     Capillary Refill: Capillary refill takes less than 2 seconds.  Neurological:     General: No focal deficit present.     Mental Status: She is alert. Mental status is at baseline.  Psychiatric:        Mood and Affect: Mood normal.     (all labs ordered are listed, but only abnormal results are displayed) Labs Reviewed - No data to  display  EKG: None  Radiology: No results found.   Procedures   Medications Ordered in the ED  albuterol  (VENTOLIN  HFA) 108 (90 Base) MCG/ACT inhaler 1-2 puff (1 puff Inhalation Given 02/29/24 1211)  predniSONE  (DELTASONE ) tablet 60 mg (60 mg Oral Given 02/29/24 1211)  diphenhydrAMINE  (BENADRYL ) capsule 50 mg (50 mg Oral Given 02/29/24 1211)  acetaminophen  (TYLENOL ) tablet 1,000 mg (1,000 mg Oral Given 02/29/24 1210)  ipratropium-albuterol  (DUONEB) 0.5-2.5 (3) MG/3ML nebulizer solution 3 mL (3 mLs Nebulization Given 02/29/24 1308)                                    Medical Decision Making Amount and/or Complexity of Data Reviewed Radiology: ordered.  Risk OTC drugs. Prescription drug management.   Medical Decision Making:   AMOY STEEVES is a 28 y.o. female who presented to the ED today with increasing shortness of breath detailed above.    Additional history discussed with patient's family/caregivers.  External chart has been reviewed including outside medication records as well as outlying facility visit records. Patient's presentation is complicated by their history of asthma and allergic rhinitis.  Complete initial physical exam performed, notably the patient  was alert and oriented no apparent distress.  Lung sounds at this time are clear and equal bilaterally.  Nasal turbinates are markedly edematous and pale.    Reviewed and confirmed nursing documentation for past medical history, family history, social history.    Initial Assessment:   With the patient's presentation of increasing shortness of breath, most likely diagnosis is asthma exacerbation.  Further consider exacerbation of her seasonal allergic rhinitis secondary to findings of enlarged pale nasal turbinates and lack of her normal medication.  Initial Plan:  Initially manage symptoms with single dose of prednisone . Has headache with dose of acetaminophen  as well as encourage oral intake of fluids. CXR to evaluate  for structural/infectious intrathoracic pathology.  Objective evaluation as below reviewed   Initial Study Results:   After initial treatment, patient declined chest x-ray as she felt that her symptoms had improved with dose of prednisone  and DuoNeb, request discharge.  Discussed this with the patient that for full assessment a chest x-ray would be beneficial to show any underlying lung disease, she understands and declines at this time.  Reassessment and Plan:   After reassessing the patient, her lungs are clear and equal and there is good air entry into all lung fields.  Based on this, as well as patient's declination of imaging, we will  plan to discharge patient with continued outpatient therapy as she is previously prescribed, will be returning home to [place]  this evening and will have available her regular medications.  As her oxygen saturations have been maintaining normal, she has been having normal breathing and lung sounds are clear with good air entry into all lung fields, feel this patient stable for discharge.       Final diagnoses:  Moderate persistent asthma with exacerbation    ED Discharge Orders     None          Myriam Dorn BROCKS, PA 02/29/24 1319    Lowther, Amy, DO 02/29/24 1436

## 2024-02-29 NOTE — ED Triage Notes (Addendum)
 Pt came in due to having some asthma flare symptoms. Pt normally takes Symbicort and albuterol  but ran out of Symbicort and has been increasingly taking her albuterol  and not getting relief that she needs. Pt reports wheezing and coughing up fleam .    Hx asthma and seasonal allergies Lung sounds clear

## 2024-02-29 NOTE — Discharge Instructions (Signed)
 Please follow-up with your primary care provider upon your return home.  Continue to use your albuterol  inhaler as needed and when returning home please return to your normal regimen.  Of course to have any further concerns prior to this please return to the emergency room for further assessment and treatment.
